# Patient Record
Sex: Male | Born: 1995 | Race: White | Hispanic: No | Marital: Single | Smoking: Never smoker
Health system: Southern US, Community
[De-identification: ages and names within clinical notes are randomized; demographics above are authoritative.]

---
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
## 2014-01-23 IMAGING — CR DG CHEST 2V
2 series · 2 of 2 positions shown · non-contrast
Comparison: None.

CLINICAL DATA: Chest pain

CHEST - 2 VIEW

[w chest pa]
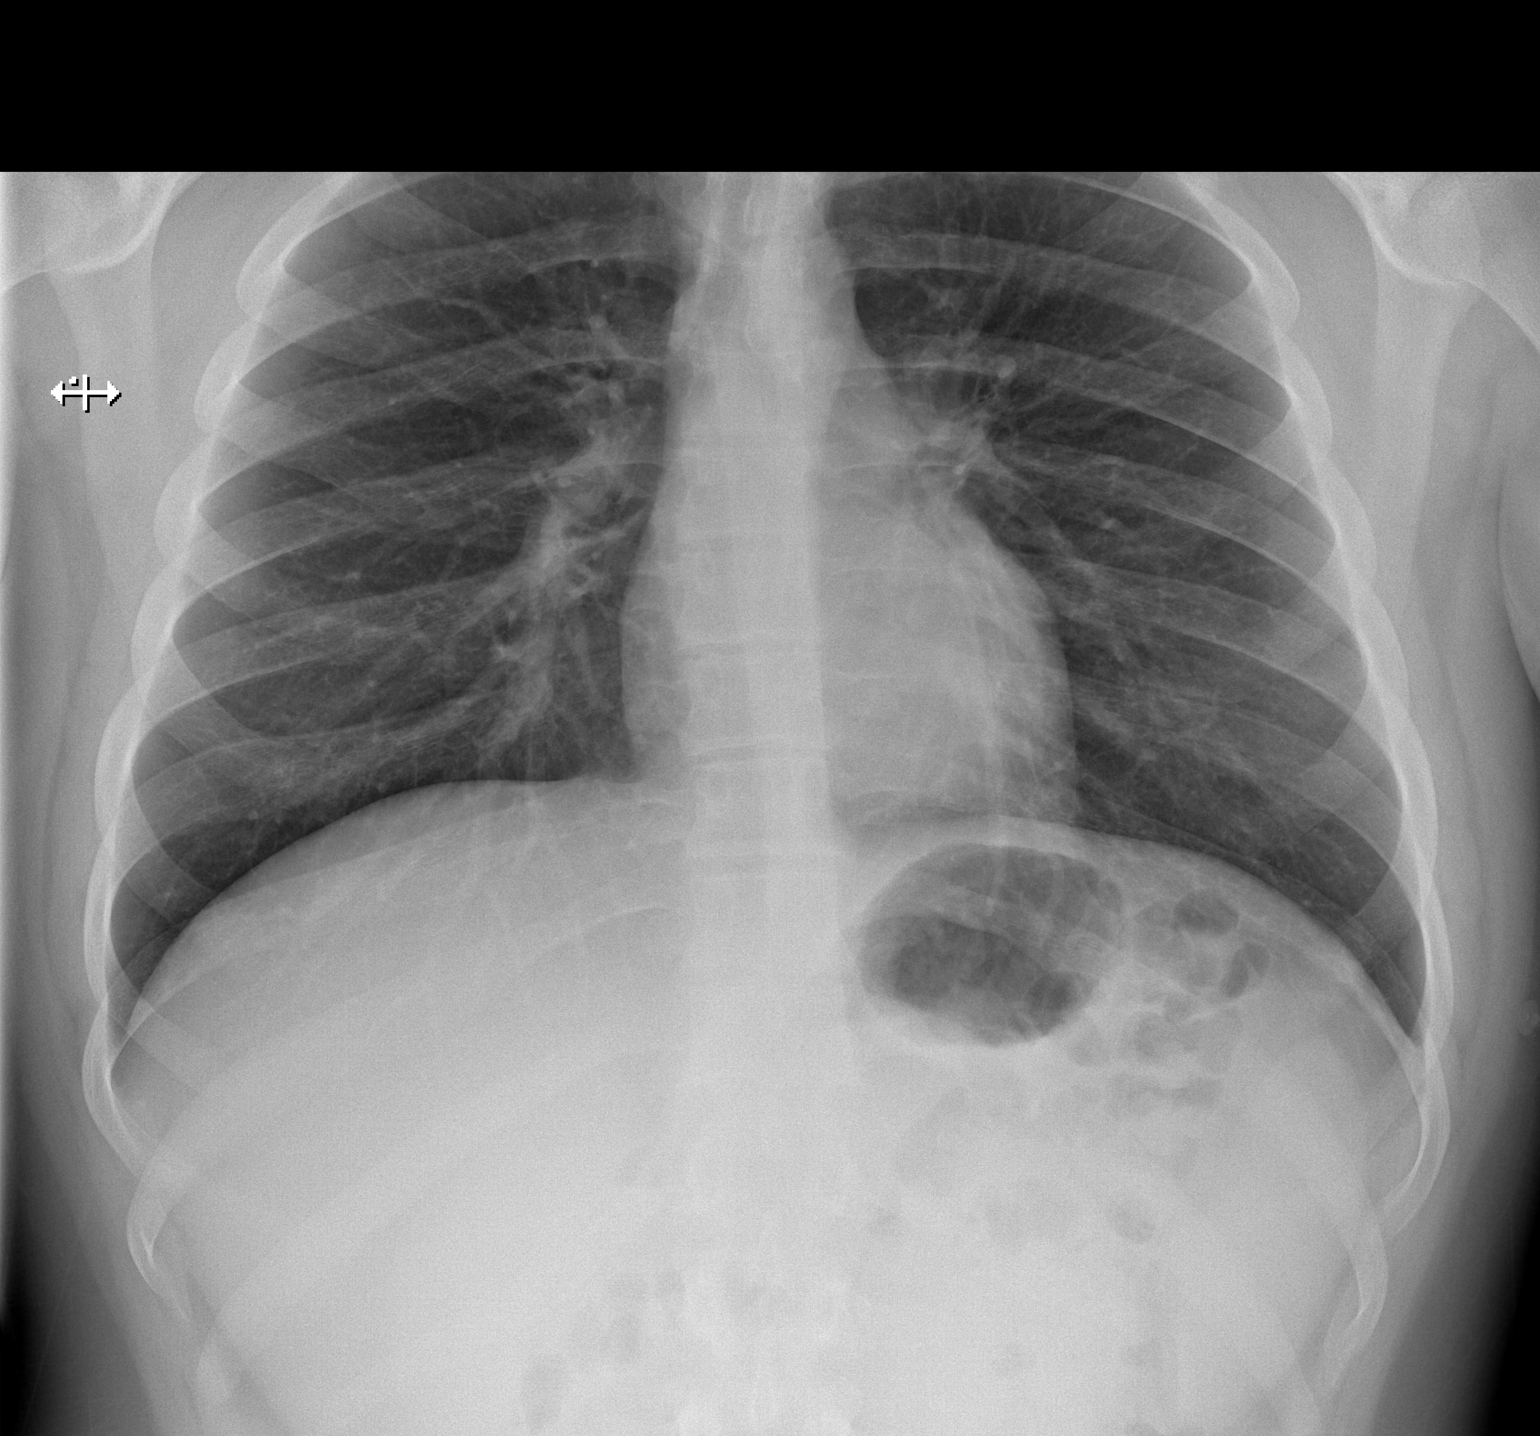

[w chest lat]
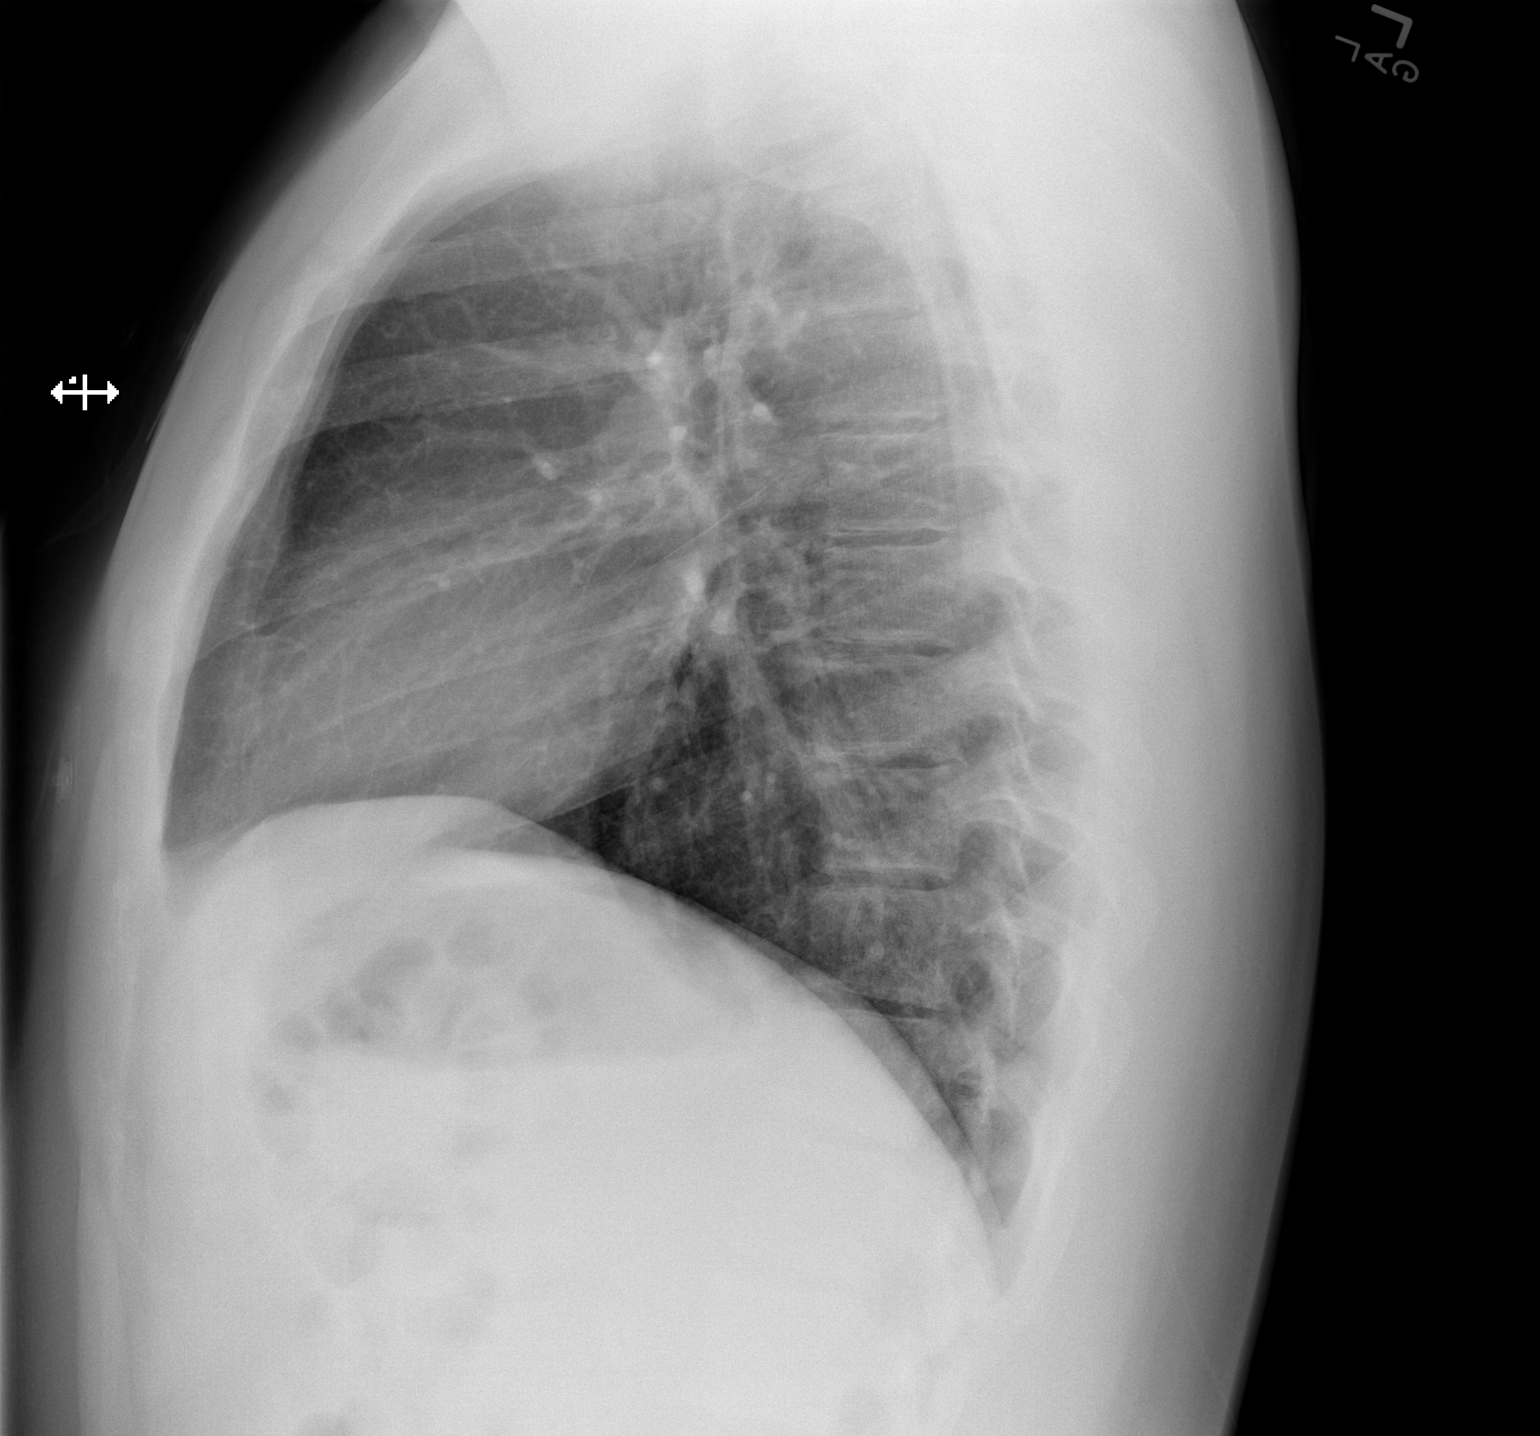

[2 of 2 positions shown; findings below may reference images not displayed]

FINDINGS: Lungs are clear. No pleural effusion or pneumothorax.

Cardiomediastinal silhouette is within normal limits.

Visualized osseous structures are within normal limits.
IMPRESSION: Normal chest radiographs.

## 2017-08-01 ENCOUNTER — Emergency Department (HOSPITAL_COMMUNITY)
Admission: EM | Admit: 2017-08-01 | Discharge: 2017-08-01 | Disposition: A | Discharge status: Home / Self Care | Payer: Self-pay | Attending: Emergency Medicine | Admitting: Emergency Medicine

## 2017-08-01 ENCOUNTER — Encounter (HOSPITAL_COMMUNITY): Payer: Self-pay | Admitting: *Deleted

## 2017-08-01 DIAGNOSIS — Y93G1 Activity, food preparation and clean up: Secondary | ICD-10-CM | POA: Insufficient documentation

## 2017-08-01 DIAGNOSIS — Y990 Civilian activity done for income or pay: Secondary | ICD-10-CM | POA: Insufficient documentation

## 2017-08-01 DIAGNOSIS — X500XXA Overexertion from strenuous movement or load, initial encounter: Secondary | ICD-10-CM | POA: Insufficient documentation

## 2017-08-01 DIAGNOSIS — Y92511 Restaurant or cafe as the place of occurrence of the external cause: Secondary | ICD-10-CM | POA: Insufficient documentation

## 2017-08-01 DIAGNOSIS — S39012A Strain of muscle, fascia and tendon of lower back, initial encounter: Secondary | ICD-10-CM | POA: Insufficient documentation

## 2017-08-01 MED ORDER — CYCLOBENZAPRINE HCL 10 MG PO TABS: 10.0000 mg | ORAL_TABLET | Freq: Two times a day (BID) | ORAL | 0 refills | Status: AC | PRN

## 2017-08-01 MED ORDER — DICLOFENAC SODIUM 50 MG PO TBEC: 50.0000 mg | DELAYED_RELEASE_TABLET | Freq: Two times a day (BID) | ORAL | 0 refills | Status: AC

## 2017-08-01 MED ORDER — CYCLOBENZAPRINE HCL 10 MG PO TABS
10.0000 mg | ORAL_TABLET | Freq: Once | ORAL | Status: AC
  Administered 2017-08-01: 10 mg via ORAL
  Filled 2017-08-01: qty 1

## 2017-08-01 NOTE — Discharge Instructions (Signed)
Do not drive while taking the muscle relaxant as it can make you sleepy. Follow up with your primary care doctor or return here if symptoms worsen.

## 2017-08-01 NOTE — ED Provider Notes (Signed)
[facility]-EMERGENCY DEPT Provider Note   CSN: [id number] Arrival date & time: 08/01/17  1441     History   Chief Complaint Chief Complaint  Patient presents with  . Back Pain    HPI Ricardo Dyer is a 21 y.o. male who presents to the ED with back pain that started 3 days ago. 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 He is unsure of an exact injury. No loss of control of bladder or bowels.  The history is provided by the patient. No language interpreter was used.  Back Pain   This is a new problem. The current episode started more than 2 days ago. The problem occurs constantly. The problem has not changed since onset.The pain is associated with no known injury. The pain is present in the lumbar spine. The pain is at a severity of 4/10. The pain is mild. Pertinent negatives include no chest pain, no fever, no numbness, no weight loss, no headaches, no abdominal pain, no bowel incontinence, no bladder incontinence, no dysuria and no leg pain. He has tried NSAIDs for the symptoms. The treatment provided moderate relief.    History reviewed. No pertinent past medical history.  There are no active problems to display for this patient.   History reviewed. No pertinent surgical history.     Home Medications    Prior to Admission medications   Medication Sig Start Date End Date Taking? Authorizing Provider  cyclobenzaprine (FLEXERIL) 10 MG tablet Take 1 tablet (10 mg total) by mouth 2 (two) times daily as needed for muscle spasms. 08/01/17   Janne Napoleon, NP  diclofenac (VOLTAREN) 50 MG EC tablet Take 1 tablet (50 mg total) by mouth 2 (two) times daily. 08/01/17   Janne Napoleon, NP    Family History No family history on file.  Social History Social History  Substance Use Topics  . Smoking status: Never Smoker  . Smokeless tobacco: Never Used  . Alcohol use No     Allergies   Patient has no known allergies.   Review of Systems Review of Systems    Constitutional: Negative for fever and weight loss.  HENT: Negative.   Cardiovascular: Negative for chest pain.  Gastrointestinal: Negative for abdominal pain, bowel incontinence, nausea and vomiting.  Genitourinary: Negative for bladder incontinence, dysuria, frequency and urgency.  Musculoskeletal: Positive for back pain.  Skin: Negative for wound.  Neurological: Negative for numbness and headaches.  Psychiatric/Behavioral: Negative for confusion. The patient is not nervous/anxious.      Physical Exam Updated Vital Signs BP (!) 143/77 (BP Location: Left Arm)   Pulse 86   Temp 98.4 F (36.9 C) (Oral)   Resp 18   SpO2 100%   Physical Exam  Constitutional: He appears well-developed and well-nourished. No distress.  HENT:  Head: Normocephalic and atraumatic.  Eyes: Pupils are equal, round, and reactive to light. EOM are normal.  Neck: Normal range of motion. Neck supple.  Cardiovascular: Normal rate and regular rhythm.   Pulmonary/Chest: Effort normal. No respiratory distress. He has no wheezes. He has no rales.  Abdominal: Soft. Bowel sounds are normal. There is no tenderness.  Musculoskeletal: Normal range of motion. He exhibits no edema.       Lumbar back: He exhibits tenderness and spasm. He exhibits normal range of motion, no deformity and normal pulse.  Neurological: He is alert. He has normal strength. Gait normal.  Reflex Scores:      Bicep  reflexes are 2+ on the right side and 2+ on the left side.      Brachioradialis reflexes are 2+ on the right side and 2+ on the left side.      Patellar reflexes are 2+ on the right side and 2+ on the left side.      Achilles reflexes are 1+ on the right side. Skin: Skin is warm and dry.  Psychiatric: He has a normal mood and affect. His behavior is normal.  Nursing note and vitals reviewed.    ED Treatments / Results  Labs (all labs ordered are listed, but only abnormal results are displayed) Labs Reviewed - No data to  display  Radiology No results found.  Procedures Procedures (including critical care time)  Medications Ordered in ED Medications  cyclobenzaprine (FLEXERIL) tablet 10 mg (not administered)     Initial Impression / Assessment and Plan / ED Course  I have reviewed the triage vital signs and the nursing notes.  Patient with back pain.  No neurological deficits and normal neuro exam.  Patient can walk but states is painful.  No loss of bowel or bladder control.  No concern for cauda equina.  No fever, night sweats, weight loss, h/o cancer, IVDU.  RICE protocol and pain medicine indicated and discussed with patient.   Final Clinical Impressions(s) / ED Diagnoses   Final diagnoses:  Lumbosacral strain, initial encounter    New Prescriptions New Prescriptions   CYCLOBENZAPRINE (FLEXERIL) 10 MG TABLET    Take 1 tablet (10 mg total) by mouth 2 (two) times daily as needed for muscle spasms.   DICLOFENAC (VOLTAREN) 50 MG EC TABLET    Take 1 tablet (50 mg total) by mouth 2 (two) times daily.     Kerrie Buffaloeese, Hope TunneltonM, [place]NP 08/01/17 1619    Doug SouJacubowitz, Sam, MD 08/01/17 [phone]

## 2017-08-01 NOTE — ED Triage Notes (Signed)
Pt complains of lower back pain for the past 3 days. Pain is worse with movement. Pt has tried heating pad and aleve, which he states helped. Pt denies blood in urine or dysuria.

## 2018-12-28 ENCOUNTER — Encounter (HOSPITAL_COMMUNITY): Payer: Self-pay

## 2018-12-28 ENCOUNTER — Emergency Department (HOSPITAL_COMMUNITY)
Admission: EM | Admit: 2018-12-28 | Discharge: 2018-12-28 | Disposition: A | Discharge status: Home / Self Care | Payer: 59 | Attending: Emergency Medicine | Admitting: Emergency Medicine

## 2018-12-28 DIAGNOSIS — Y93G1 Activity, food preparation and clean up: Secondary | ICD-10-CM | POA: Diagnosis not present

## 2018-12-28 DIAGNOSIS — W268XXA Contact with other sharp object(s), not elsewhere classified, initial encounter: Secondary | ICD-10-CM | POA: Insufficient documentation

## 2018-12-28 DIAGNOSIS — S61012A Laceration without foreign body of left thumb without damage to nail, initial encounter: Secondary | ICD-10-CM | POA: Insufficient documentation

## 2018-12-28 DIAGNOSIS — Z23 Encounter for immunization: Secondary | ICD-10-CM | POA: Insufficient documentation

## 2018-12-28 DIAGNOSIS — Z79899 Other long term (current) drug therapy: Secondary | ICD-10-CM | POA: Diagnosis not present

## 2018-12-28 DIAGNOSIS — Y990 Civilian activity done for income or pay: Secondary | ICD-10-CM | POA: Insufficient documentation

## 2018-12-28 DIAGNOSIS — Y9289 Other specified places as the place of occurrence of the external cause: Secondary | ICD-10-CM | POA: Diagnosis not present

## 2018-12-28 MED ORDER — TETANUS-DIPHTH-ACELL PERTUSSIS 5-2.5-18.5 LF-MCG/0.5 IM SUSP
0.5000 mL | Freq: Once | INTRAMUSCULAR | Status: AC
  Administered 2018-12-28: 0.5 mL via INTRAMUSCULAR
  Filled 2018-12-28: qty 0.5

## 2018-12-28 NOTE — ED Provider Notes (Signed)
[facility]-EMERGENCY DEPT Provider Note   CSN: [id number] Arrival date & time: 12/28/18  0039     History   Chief Complaint Chief Complaint  Patient presents with  . Laceration    Left thumb    HPI Ricardo Dyer is a 23 y.o. male.  Pt presents to the ED today with a laceration to his left thumb.  Pt said he was slicing mint at work, when he turned away and sliced his finger.  He has not had a tetanus shot since high school.       History reviewed. No pertinent past medical history.  There are no active problems to display for this patient.   History reviewed. No pertinent surgical history.      Home Medications    Prior to Admission medications   Medication Sig Start Date End Date Taking? Authorizing Provider  cyclobenzaprine (FLEXERIL) 10 MG tablet Take 1 tablet (10 mg total) by mouth 2 (two) times daily as needed for muscle spasms. 08/01/17   Janne NapoleonNeese, Hope M, NP  diclofenac (VOLTAREN) 50 MG EC tablet Take 1 tablet (50 mg total) by mouth 2 (two) times daily. 08/01/17   Janne NapoleonNeese, Hope M, NP    Family History No family history on file.  Social History Social History   Tobacco Use  . Smoking status: Never Smoker  . Smokeless tobacco: Never Used  Substance Use Topics  . Alcohol use: No  . Drug use: Not on file     Allergies   Patient has no known allergies.   Review of Systems Review of Systems  Skin: Positive for wound.  All other systems reviewed and are negative.    Physical Exam Updated Vital Signs Ht 6\' 1"  (1.854 m)   Wt 101.6 kg   BMI 29.55 kg/m   Physical Exam Vitals signs and nursing note reviewed.  Constitutional:      Appearance: Normal appearance.  HENT:     Head: Normocephalic and atraumatic.  Cardiovascular:     Rate and Rhythm: Normal rate and regular rhythm.  Pulmonary:     Effort: Pulmonary effort is normal.     Breath sounds: Normal breath sounds.  Abdominal:     General: Abdomen is flat.   Palpations: Abdomen is soft.  Skin:    Comments: Laceration to left side nail bed.  Neurological:     Mental Status: He is alert.      ED Treatments / Results  Labs (all labs ordered are listed, but only abnormal results are displayed) Labs Reviewed - No data to display  EKG None  Radiology No results found.  Procedures .Marland Kitchen.Laceration Repair Date/Time: 12/28/2018 2:11 AM Performed by: Jacalyn LefevreHaviland, Dorene Bruni, MD Authorized by: Jacalyn LefevreHaviland, Jeananne Bedwell, MD   Consent:    Consent obtained:  Verbal   Consent given by:  Patient   Risks discussed:  Infection   Alternatives discussed:  No treatment Anesthesia (see MAR for exact dosages):    Anesthesia method:  None Laceration details:    Location:  Finger   Finger location:  L thumb   Length (cm):  1 Repair type:    Repair type:  Simple Treatment:    Area cleansed with:  Saline   Amount of cleaning:  Standard Skin repair:    Repair method:  Tissue adhesive Post-procedure details:    Dressing:  Open (no dressing)   Patient tolerance of procedure:  Tolerated well, no immediate complications   (including critical care time)  Medications Ordered in  ED Medications  Tdap (BOOSTRIX) injection 0.5 mL (0.5 mLs Intramuscular Given 12/28/18 0144)     Initial Impression / Assessment and Plan / ED Course  I have reviewed the triage vital signs and the nursing notes.  Pertinent labs & imaging results that were available during my care of the patient were reviewed by me and considered in my medical decision making (see chart for details).    Laceration could have used 1 stitch, but pt did not want stitches and requested glue.  He knows to return here if his thumb starts to bleed.   Final Clinical Impressions(s) / ED Diagnoses   Final diagnoses:  Laceration of left thumb without foreign body without damage to nail, initial encounter    ED Discharge Orders    None       Jacalyn Lefevre, MD 12/28/18 [phone]

## 2018-12-28 NOTE — ED Notes (Signed)
Dermabond at bedside for provider 

## 2018-12-28 NOTE — ED Triage Notes (Signed)
Pt states he cut his left thumb cutting mint tonight, small laceration by the thumb nail. Bleeding controlled at this time.

## 2024-07-08 ENCOUNTER — Ambulatory Visit
Admission: EM | Admit: 2024-07-08 | Discharge: 2024-07-08 | Disposition: A | Discharge status: Home / Self Care | Payer: Self-pay | Attending: Family Medicine | Admitting: Family Medicine

## 2024-07-08 ENCOUNTER — Encounter: Payer: Self-pay | Admitting: Emergency Medicine

## 2024-07-08 DIAGNOSIS — R319 Hematuria, unspecified: Secondary | ICD-10-CM | POA: Insufficient documentation

## 2024-07-08 LAB — POCT URINE DIPSTICK
Bilirubin, UA: NEGATIVE
Glucose, UA: NEGATIVE mg/dL
Leukocytes, UA: NEGATIVE
Nitrite, UA: NEGATIVE
POC PROTEIN,UA: 30 — AB
Spec Grav, UA: 1.025 (ref 1.010–1.025)
Urobilinogen, UA: 0.2 U/dL
pH, UA: 7 (ref 5.0–8.0)

## 2024-07-08 MED ORDER — TAMSULOSIN HCL 0.4 MG PO CAPS: 0.4000 mg | ORAL_CAPSULE | Freq: Every day | ORAL | 0 refills | Status: AC

## 2024-07-08 NOTE — ED Provider Notes (Signed)
 [facility] - URGENT CARE C[facility]  Note:  This document was prepared using Conservation officer, historic buildings and may include unintentional dictation errors.  MRN: [id number] DOB: July 05, 1996  Subjective:   Ricardo Dyer is a 28 y.o. male presenting for 2 day history of discolored urine now have hematuria. Today is improved. Denies dysuria, urinary frequency, penile discharge, penile swelling, testicular swelling, anal pain, groin pain, nausea, vomiting, abdominal pain, flank pain, diaphoresis. While providing the urine sample in clinic today, he did feel mild testicular discomfort. But otherwise, has not had any pain. No history of renal stones. No history of hematuria. Does not hydrate as much as he should, works strenuous job.  No concern for sexually transmitted infection.  No current facility-administered medications for this encounter.  Current Outpatient Medications:    cyclobenzaprine  (FLEXERIL ) 10 MG tablet, Take 1 tablet (10 mg total) by mouth 2 (two) times daily as needed for muscle spasms., Disp: 20 tablet, Rfl: 0   diclofenac  (VOLTAREN ) 50 MG EC tablet, Take 1 tablet (50 mg total) by mouth 2 (two) times daily., Disp: 15 tablet, Rfl: 0   No Known Allergies  History reviewed. No pertinent past medical history.   History reviewed. No pertinent surgical history.  History reviewed. No pertinent family history.  Social History   Tobacco Use   Smoking status: Never    Passive exposure: Current   Smokeless tobacco: Never  Vaping Use   Vaping status: Never Used  Substance Use Topics   Alcohol use: No    ROS   Objective:   Vitals: BP 112/75 (BP Location: Left Arm)   Pulse 83   Temp 97.8 F (36.6 C) (Oral)   Resp 16   Wt 230 lb (104.3 kg)   SpO2 99%   BMI 30.34 kg/m   Physical Exam Constitutional:      General: He is not in acute distress.    Appearance: Normal appearance. He is well-developed and normal weight. He is not ill-appearing, toxic-appearing or  diaphoretic.  HENT:     Head: Normocephalic and atraumatic.     Right Ear: External ear normal.     Left Ear: External ear normal.     Nose: Nose normal.     Mouth/Throat:     Pharynx: Oropharynx is clear.  Eyes:     General: No scleral icterus.       Right eye: No discharge.        Left eye: No discharge.     Extraocular Movements: Extraocular movements intact.  Cardiovascular:     Rate and Rhythm: Normal rate.  Pulmonary:     Effort: Pulmonary effort is normal.  Musculoskeletal:     Cervical back: Normal range of motion.  Neurological:     Mental Status: He is alert and oriented to person, place, and time.  Psychiatric:        Mood and Affect: Mood normal.        Behavior: Behavior normal.        Thought Content: Thought content normal.        Judgment: Judgment normal.     Results for orders placed or performed during the hospital encounter of 07/08/24 (from the past 24 hours)  POCT URINE DIPSTICK     Status: Abnormal   Collection Time: 07/08/24  1:48 PM  Result Value Ref Range   Color, UA yellow yellow   Clarity, UA cloudy (A) clear   Glucose, UA negative negative mg/dL   Bilirubin, UA negative  negative   Ketones, POC UA trace (5) (A) negative mg/dL   Spec Grav, UA 8.974 8.989 - 1.025   Blood, UA large (A) negative   pH, UA 7.0 5.0 - 8.0   POC PROTEIN,UA =30 (A) negative, trace   Urobilinogen, UA 0.2 0.2 or 1.0 E.U./dL   Nitrite, UA Negative Negative   Leukocytes, UA Negative Negative    Assessment and Plan :   PDMP not reviewed this encounter.  1. Hematuria, unspecified type    Urine culture pending. STI testing declined. Hydrate aggressively.  Avoid urinary irritants.  Start Flomax .  Primary suspicion is renal colic.  Low suspicion for urologic emergency.  Patient prefers not to strain his urine.  Counseled patient on potential for adverse effects with medications prescribed/recommended today, ER and return-to-clinic precautions discussed, patient verbalized  understanding.    Christopher Savannah, PA-C 07/08/24 1435

## 2024-07-08 NOTE — ED Triage Notes (Addendum)
 Pt presents c/o hematuria x 2 days. Pt reports he first noticed an orangish color in the toilet yesterday while using the restroom at work.. Pt denies emesis, abd pain or change in urination schedule.

## 2024-07-08 NOTE — Discharge Instructions (Addendum)
 Please start tamsulosin  to promote urine flow. Make sure you hydrate very well with plain water and a quantity of 96 ounces of water a day.  Please limit drinks that are considered urinary irritants such as soda, sweet tea, coffee, energy drinks, alcohol.  These can worsen your urinary and genital symptoms but also be the source of them.  I will let you know about your urine culture results through MyChart to see if we need to prescribe or change your antibiotics based off of those results.

## 2024-07-09 LAB — URINE CULTURE: Culture: NO GROWTH

## 2024-07-11 ENCOUNTER — Ambulatory Visit (HOSPITAL_COMMUNITY): Payer: Self-pay
